# Patient Record
Sex: Male | Born: 1955 | Race: Black or African American | Hispanic: No | Smoking: Never smoker
Health system: Southern US, Community
[De-identification: ages and names within clinical notes are randomized; demographics above are authoritative.]

## PROBLEM LIST (undated history)

## (undated) HISTORY — PX: MANDIBLE FRACTURE SURGERY: SHX706

---

## 2011-05-05 ENCOUNTER — Emergency Department (HOSPITAL_BASED_OUTPATIENT_CLINIC_OR_DEPARTMENT_OTHER)
Admission: EM | Admit: 2011-05-05 | Discharge: 2011-05-05 | Disposition: A | Discharge status: Home / Self Care | Payer: No Typology Code available for payment source | Attending: Emergency Medicine | Admitting: Emergency Medicine

## 2011-05-05 ENCOUNTER — Emergency Department (INDEPENDENT_AMBULATORY_CARE_PROVIDER_SITE_OTHER): Payer: No Typology Code available for payment source

## 2011-05-05 DIAGNOSIS — Y92410 Unspecified street and highway as the place of occurrence of the external cause: Secondary | ICD-10-CM | POA: Insufficient documentation

## 2011-05-05 DIAGNOSIS — S139XXA Sprain of joints and ligaments of unspecified parts of neck, initial encounter: Secondary | ICD-10-CM | POA: Insufficient documentation

## 2011-05-05 DIAGNOSIS — M542 Cervicalgia: Secondary | ICD-10-CM

## 2011-05-05 DIAGNOSIS — S161XXA Strain of muscle, fascia and tendon at neck level, initial encounter: Secondary | ICD-10-CM

## 2011-05-05 MED ORDER — HYDROCODONE-ACETAMINOPHEN 5-500 MG PO TABS: 1.0000 | ORAL_TABLET | Freq: Four times a day (QID) | ORAL | Status: AC | PRN
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
## 2011-05-17 NOTE — ED Provider Notes (Signed)
Evaluation and management procedures were performed by the PA/NP under my supervision/collaboration.   Dione Booze, MD 05/17/11 1224

## 2015-01-12 ENCOUNTER — Encounter (HOSPITAL_BASED_OUTPATIENT_CLINIC_OR_DEPARTMENT_OTHER): Payer: Self-pay | Admitting: *Deleted

## 2015-01-12 ENCOUNTER — Emergency Department (HOSPITAL_BASED_OUTPATIENT_CLINIC_OR_DEPARTMENT_OTHER)
Admission: EM | Admit: 2015-01-12 | Discharge: 2015-01-12 | Disposition: A | Discharge status: Home / Self Care | Payer: 59 | Attending: Emergency Medicine | Admitting: Emergency Medicine

## 2015-01-12 DIAGNOSIS — Z880 Allergy status to penicillin: Secondary | ICD-10-CM | POA: Diagnosis not present

## 2015-01-12 DIAGNOSIS — R21 Rash and other nonspecific skin eruption: Secondary | ICD-10-CM | POA: Insufficient documentation

## 2015-01-12 NOTE — Discharge Instructions (Signed)
Please call your doctor for a followup appointment within 24-48 hours. When you talk to your doctor please let them know that you were seen in the emergency department and have them acquire all of your records so that they can discuss the findings with you and formulate a treatment plan to fully care for your new and ongoing problems. Please follow-up with primary care provider within approximately 24-48 hours Please rest and stay hydrated Please take into consideration when you put into and onto your body Please avoid any physical or strenuous activity Please continue to monitor symptoms closely and if symptoms are to worsen or change (fever greater than 101, chills, sweating, nausea, vomiting, chest pain, shortness of breathe, difficulty breathing, weakness, numbness, tingling, worsening or changes to pain pattern, rash returns, swelling, neck pain, neck stiffness, throat closing sensation, tongue swelling, visual changes, back pain, inability to control urine or bowel movements, leg swelling, nausea, vomiting, dizziness, fainting) please report back to the Emergency Department immediately.    Contact Dermatitis Contact dermatitis is a reaction to certain substances that touch the skin. Contact dermatitis can be either irritant contact dermatitis or allergic contact dermatitis. Irritant contact dermatitis does not require previous exposure to the substance for a reaction to occur.Allergic contact dermatitis only occurs if you have been exposed to the substance before. Upon a repeat exposure, your body reacts to the substance.  CAUSES  Many substances can cause contact dermatitis. Irritant dermatitis is most commonly caused by repeated exposure to mildly irritating substances, such as:  Makeup.  Soaps.  Detergents.  Bleaches.  Acids.  Metal salts, such as nickel. Allergic contact dermatitis is most commonly caused by exposure to:  Poisonous plants.  Chemicals (deodorants,  shampoos).  Jewelry.  Latex.  Neomycin in triple antibiotic cream.  Preservatives in products, including clothing. SYMPTOMS  The area of skin that is exposed may develop:  Dryness or flaking.  Redness.  Cracks.  Itching.  Pain or a burning sensation.  Blisters. With allergic contact dermatitis, there may also be swelling in areas such as the eyelids, mouth, or genitals.  DIAGNOSIS  Your caregiver can usually tell what the problem is by doing a physical exam. In cases where the cause is uncertain and an allergic contact dermatitis is suspected, a patch skin test may be performed to help determine the cause of your dermatitis. TREATMENT Treatment includes protecting the skin from further contact with the irritating substance by avoiding that substance if possible. Barrier creams, powders, and gloves may be helpful. Your caregiver may also recommend:  Steroid creams or ointments applied 2 times daily. For best results, soak the rash area in cool water for 20 minutes. Then apply the medicine. Cover the area with a plastic wrap. You can store the steroid cream in the refrigerator for a "chilly" effect on your rash. That may decrease itching. Oral steroid medicines may be needed in more severe cases.  Antibiotics or antibacterial ointments if a skin infection is present.  Antihistamine lotion or an antihistamine taken by mouth to ease itching.  Lubricants to keep moisture in your skin.  Burow's solution to reduce redness and soreness or to dry a weeping rash. Mix one packet or tablet of solution in 2 cups cool water. Dip a clean washcloth in the mixture, wring it out a bit, and put it on the affected area. Leave the cloth in place for 30 minutes. Do this as often as possible throughout the day.  Taking several cornstarch or baking soda baths  daily if the area is too large to cover with a washcloth. Harsh chemicals, such as alkalis or acids, can cause skin damage that is like a burn.  You should flush your skin for 15 to 20 minutes with cold water after such an exposure. You should also seek immediate medical care after exposure. Bandages (dressings), antibiotics, and pain medicine may be needed for severely irritated skin.  HOME CARE INSTRUCTIONS  Avoid the substance that caused your reaction.  Keep the area of skin that is affected away from hot water, soap, sunlight, chemicals, acidic substances, or anything else that would irritate your skin.  Do not scratch the rash. Scratching may cause the rash to become infected.  You may take cool baths to help stop the itching.  Only take over-the-counter or prescription medicines as directed by your caregiver.  See your caregiver for follow-up care as directed to make sure your skin is healing properly. SEEK MEDICAL CARE IF:   Your condition is not better after 3 days of treatment.  You seem to be getting worse.  You see signs of infection such as swelling, tenderness, redness, soreness, or warmth in the affected area.  You have any problems related to your medicines. Document Released: 08/11/2000 Document Revised: 11/06/2011 Document Reviewed: 01/17/2011 [facility] Patient Information 2015 [employer], [place]. This information is not intended to replace advice given to you by your health care provider. Make sure you discuss any questions you have with your health care provider.   Emergency Department Resource Guide 1) Find a Doctor and Pay Out of Pocket Although you won't have to find out who is covered by your insurance plan, it is a good idea to ask around and get recommendations. You will then need to call the office and see if the doctor you have chosen will accept you as a new patient and what types of options they offer for patients who are self-pay. Some doctors offer discounts or will set up payment plans for their patients who do not have insurance, but you will need to ask so you aren't surprised when you get to your  appointment.  2) Contact Your Local Health Department Not all health departments have doctors that can see patients for sick visits, but many do, so it is worth a call to see if yours does. If you don't know where your local health department is, you can check in your phone book. The CDC also has a tool to help you locate your state's health department, and many state websites also have listings of all of their local health departments.  3) Find a Walk-in Clinic If your illness is not likely to be very severe or complicated, you may want to try a walk in clinic. These are popping up all over the country in pharmacies, drugstores, and shopping centers. They're usually staffed by nurse practitioners or physician assistants that have been trained to treat common illnesses and complaints. They're usually fairly quick and inexpensive. However, if you have serious medical issues or chronic medical problems, these are probably not your best option.  No Primary Care Doctor: - Call Health Connect at  [phone] - they can help you locate a primary care doctor that  accepts your insurance, provides certain services, etc. - Physician Referral Service- [phone]  Chronic Pain Problems: Organization         Address  Phone   Notes  [facility]  [phone] Patients need to be referred by their primary care  doctor.   Medication Assistance: Organization         Address  Phone   Notes  [facility] Medication [facility] [address] [place] [place], [place] [address] [phone] --Must be a resident of [facility] -- Must have NO insurance coverage whatsoever (no Medicaid/ Medicare, etc.) -- The pt. MUST have a primary care doctor that directs their care regularly and follows them in the community   [facility]  [phone]   [employer]  [phone]    Agencies that provide inexpensive medical care: Organization         Address  Phone   Notes  [facility]  [phone]   [facility]    [phone]   [facility] [address] [place], [place] [address] [phone]   [facility] [address] [place], [place] [phone]   Planned Parenthood    [phone]   [facility]    [phone]   [facility]  [address], [place] Phone:  [phone], Fax:  [phone] Hours of Operation:  9 am - 6 pm, M-F.  Also accepts Medicaid/Medicare and self-pay.  [facility]  [address], [place] Phone: [phone], Fax: [phone]. Hours of Operation:  8:30 am - 5:30 pm, M-F.  Also accepts Medicaid and self-pay.  [facility] [place] [address], [place] Phone: [phone]   [facility] [address] Natasha Bence [place], [place] [phone], Ext. [phone] Mondays & Thursdays: 7-9 AM.  First 15 patients are seen on a first come, first serve basis.    Medicaid-accepting [facility] Providers:  Organization         Address  Phone   Notes  [facility] [address],  [phone] Also accepts self-pay patients.  [facility] [address] Laurell Josephs [place], [place]  [phone]   [facility] [address], [place] [phone]   [facility] [address], [place] [phone]   Renaye Rakers [address], [place]   [phone] Only accepts [place] Access [place] patients after they have their name applied to their card.   Self-Pay (no insurance) in [facility]:  Organization         Address  Phone   Notes  Sickle Cell Patients, [facility] [address] [place], [place] [phone]   [facility] [address] [place], [place] [phone]   [facility]  [place]  [address], [place] [phone]   [facility]/Dr. Osei-Bonsu  [address], [place] or [address], [place] [phone] Phone number for both [place] and [place] locations is the same.  [facility]gent Medical and [facility] [address], [place] [phone]   [facility] [address] [place] or [address] [phone]   [facility] [address] [place] [phone], phone; [phone], fax Sees patients 1st and 3rd Saturday of every month.  Must not qualify for public or private insurance (i.e. Medicaid, Medicare, [place] Health Choice, Veterans' Benefits)  Household income should be no more than 200% of the  poverty level The clinic cannot treat you if you are pregnant or think you are pregnant  Sexually transmitted diseases are not treated at the clinic.    Dental Care: Organization         Address  Phone  Notes  [facility] [address], [place] [phone]) [phone] Accepts children up to age 321 who are enrolled in [place] or [place] Health Choice; pregnant women with a Medicaid card; and children who have applied for Medicaid or [place] Health Choice, but were declined, whose parents can pay a reduced fee at time of service.  [facility] [place]  [address], [place] [phone] Accepts children up to age 57 who are enrolled in [place] or [place] Health Choice; pregnant women with a Medicaid card; and children who have applied for Medicaid or [place] Health Choice, but were declined, whose parents can pay a reduced fee at time of service.  [facility]  [address], [place] [phone]) [phone] Patients are seen by appointment only. Walk-ins are not accepted. [facility] will see patients 818 years of age and  older. Monday - Tuesday (8am-5pm) Most Wednesdays (8:30-5pm) $30 per visit, cash only  [facility]  [address], [facility] [place] [phone]) [phone] Patients are seen by appointment only. Walk-ins are not accepted. [facility] will see patients 57 years of age and older. One Wednesday Evening (Monthly: Volunteer Based).  $30 per visit, cash only  [employer] [facility]  [phone] for adults; Children under age 324, call Graduate Pediatric Dentistry at [phone]. Children aged 474-14, please call [phone] to request a pediatric application.  Dental services are provided in all areas of dental care including fillings, crowns and bridges, complete and partial dentures, implants, gum treatment, root canals, and extractions. Preventive care is also provided. Treatment is provided to both adults and children. Patients are selected via a lottery and there is often a waiting list.   [facility] [address], [place]  [phone]) [phone] [email or web address]   [facility] [address], [place], [place] [phone], Ext. [phone] Second and Fourth Thursday of each month, opens at 6:30 AM; Clinic ends at 9 AM.  Patients are seen on a first-come first-served basis, and a limited number are seen during each clinic.   [facility]  [address], [place], [place] [phone]) [phone]   Eligibility Requirements You must have lived in [place], [place], or [place] counties for at least the last three months.   You cannot be eligible for state or federal sponsored [place] Cityhealthcare insurance, including CIGNAVeterans Administration, [place]icaid, or [employer]icare.   You generally cannot be eligible for healthcare insurance through your employer.    How to apply: Eligibility screenings are held every Tuesday and Wednesday afternoon from 1:00 pm until 4:00 pm. You do not need an appointment for the interview!  [facility] [address],  [place], [place] [phone]   [facility]  [phone]   [facility]  [phone]   [facility]  [phone]    Behavioral Health Resources in the Community: Intensive Outpatient Programs Organization         Address  Phone  Notes  [facility] [address], [place], [place] [phone]   [facility] [address], [place], [place] [phone]   [facility] [address], [place], [place]  [phone]   [facility] Mental Health [address]  [place], [place] [phone] or [phone]   Substance Abuse Resources Organization         Address  Phone  Notes  [facility] Drug Services  [phone]   [facility]  [phone]   [facility]  [phone]   Floydene Flock  [phone]   Residential & Outpatient Substance Abuse Program  [phone]   Psychological Services Organization         Address  Phone  Notes  [facility]  [phone]   [facility] Services  [phone]   [facility] Mental Health [address], [place] [phone] or [phone]    Mobile Crisis Teams Organization         Address  Phone  Notes  [facility], Mobile Crisis Care Unit  [phone]   [facility]  [address] [place], [place] [phone]   Doristine Locks [address] [place] [phone]    Self-Help/Support Groups Organization         Address  Phone             Notes  [facility] [place] - variety of support groups  [phone]- I7437963 Call for more information  Narcotics Anonymous (NA), [facility] [address], [employer] [place]  2 meetings at this location   Statistician         Address  Phone  Notes  [facility] [address],    [place]  [phone]   [facility]  [address] [place] [address], [place], [place] [phone]   [facility] [address] [place], [place] [phone] Admissions: 8am-3pm M-F  [facility] [address],    [place], [place] [phone]   [facility] [address] [place], [place], [place] [phone]   [facility] [address]  [place], [place] [phone]   [facility] - Intensive Outpatient [address] Laurell Josephs [address], [place], [place] [phone]   [facility] ([facility] [address]  [place], [place] [phone] or [phone]   [facility] ([facility]) [address], [place], [place] [phone] Accepts Medicaid  [facility] [place] [address],  [place] [phone] Substance Abuse/Addiction Treatment   [facility] Organization         Address  Phone  Notes  [facility]  [phone]   Angie Fava, PhD [address] Ervin Knack [place], [place]   [phone] or [phone]   [facility]   [address] [place], [place] [phone]   [facility] [address] [place], [place] [phone] Insurance/Medicaid/sponsorship through [facility] [place] [facility] Families [address]                                    [place], [place] [phone] Therapy/tele-psych/case  [facility] [address], [place] [phone]    Dr. Lolly Mustache  [phone]   [facility] [place]  [employer] [facility] Dept. 1) [address] [place] 2) [address], [place] 3)  [address], [place] [phone])  [phone]) [phone]  [phone](336) [phone]   [place] Child Abuse Hotline [phone]) [phone] or [phone]) [phone] (After Hours)

## 2015-01-12 NOTE — ED Provider Notes (Signed)
CSN: [id number]     Arrival date & time 01/12/15  1631 History   First MD Initiated Contact with Patient 01/12/15 1647     Chief Complaint  Patient presents with  . Rash     (Consider location/radiation/quality/duration/timing/severity/associated sxs/prior Treatment) The history is provided by the patient. No language interpreter was used.  Thomas Davis is a 59 year old male with past medical history of mandible fracture surgery presenting to the ED with rash localized to his back, widespread, that occurred at a proximally 3:30 AM this morning. Patient reported that yesterday when he went to bed at approximately 11:20 PM there is nothing there and he felt fine. Reported that he had a rash all over back described as quarter, nickel and dime size with small red raised lesions with mild itching. Stated that he went to work and stated that when he checked his back at approximately 9:00 AM this morning the rash was gone. Reported that he has some intermittent "heating" sensation to both sides of his back but denies any pain. Patient reports that he has used nothing for the rash. Denied fall, injury, urinary bowel continence, travels, hiking, bug bites, animals in the house, bleeding, pus drainage, fever, chills, chest pain, shortness of breath, difficulty breathing, tongue swelling, throat closing sensation, numbness, tingling, nausea, vomiting, dizziness, neck pain, neck stiffness, fainting, Donald pain, cough, nasal congestion. Denied changes to fabric softener/detergents/lotion/creams/cologne/food. PCP Dr. Carlynn PurlPerez  History reviewed. No pertinent past medical history. Past Surgical History  Procedure Laterality Date  . Mandible fracture surgery     History reviewed. No pertinent family history. History  Substance Use Topics  . Smoking status: Never Smoker   . Smokeless tobacco: Never Used  . Alcohol Use: Yes     Comment: occ    Review of Systems  Constitutional: Negative for fever and  chills.  HENT: Negative for congestion, sore throat and trouble swallowing.   Eyes: Negative for visual disturbance.  Respiratory: Negative for chest tightness and shortness of breath.   Cardiovascular: Negative for chest pain.  Gastrointestinal: Negative for nausea, vomiting and abdominal pain.  Musculoskeletal: Negative for back pain, neck pain and neck stiffness.  Skin: Positive for rash. Negative for color change.  Neurological: Negative for dizziness, weakness, numbness and headaches.      Allergies  Penicillins  Home Medications   Prior to Admission medications   Not on File   BP 119/77 mmHg  Pulse 63  Temp(Src) 98.2 F (36.8 C)  Resp 16  Ht 5\' 7"  (1.702 m)  Wt 227 lb (102.967 kg)  BMI 35.55 kg/m2  SpO2 100% Physical Exam  Constitutional: He is oriented to person, place, and time. He appears well-developed and well-nourished. No distress.  HENT:  Head: Normocephalic and atraumatic.  Mouth/Throat: Oropharynx is clear and moist. No oropharyngeal exudate.  Negative tongue swelling Negative signs of angioedema  Eyes: Conjunctivae and EOM are normal. Pupils are equal, round, and reactive to light. Right eye exhibits no discharge. Left eye exhibits no discharge.  Neck: Normal range of motion. Neck supple. No tracheal deviation present.  Cardiovascular: Normal rate, regular rhythm and normal heart sounds.  Exam reveals no friction rub.   No murmur heard. Pulses:      Radial pulses are 2+ on the right side, and 2+ on the left side.       Dorsalis pedis pulses are 2+ on the right side, and 2+ on the left side.  Cap refill less than 3 seconds  Pulmonary/Chest: Effort normal  and breath sounds normal. No respiratory distress. He has no wheezes. He has no rales.  Patient is able to speak in full sentences without difficulty Negative use of accessory muscles Negative stridor  Musculoskeletal: Normal range of motion.  Full ROM to upper and lower extremities without difficulty  noted, negative ataxia noted.  Lymphadenopathy:    He has no cervical adenopathy.  Neurological: He is alert and oriented to person, place, and time. No cranial nerve deficit. He exhibits normal muscle tone. Coordination normal.  Skin: Skin is warm and dry. No rash noted. He is not diaphoretic. No erythema.  Negative findings of rash, lesions, sores identified to the entire body. Negative findings of rash noted to the palms of the hands or soles of the feet.  Psychiatric: He has a normal mood and affect. His behavior is normal. Thought content normal.  Nursing note and vitals reviewed.   ED Course  Procedures (including critical care time) Labs Review Labs Reviewed - No data to display  Imaging Review No results found.   EKG Interpretation None      MDM   Final diagnoses:  Rash and nonspecific skin eruption    Medications - No data to display  Filed Vitals:   01/12/15 1636 01/12/15 1810  BP: 120/77 119/77  Pulse: 70 63  Temp: 98.2 F (36.8 C)   Resp: 16 16  Height: 5\' 7"  (1.702 m)   Weight: 227 lb (102.967 kg)   SpO2: 100% 100%   Patient presenting to the ED with a rash that occurred approximately 3:30 AM this morning with resolution around 9:00 AM this morning. Physical examination unremarkable. Negative findings of rash at this time. Definitive etiology of rash unknown. Patient stable, afebrile. Patient not septic appearing. Negative findings of tongue swelling or throat closing sensation-negative involvement of airway. Negative signs of respiratory distress. Discharged patient. Referred patient to PCP. Discussed with patient to closely monitor symptoms and if symptoms are to worsen or change to report back to the ED - strict return instructions given.  Patient agreed to plan of care, understood, all questions answered.   Raymon MuttonMarissa Akshaya Toepfer, PA-C 01/12/15 1821  Pricilla LovelessScott Goldston, MD 01/13/15 [phone]

## 2015-01-12 NOTE — ED Notes (Signed)
Pt c/o rash to back this am  , denies rash now

## 2015-06-21 ENCOUNTER — Encounter (HOSPITAL_BASED_OUTPATIENT_CLINIC_OR_DEPARTMENT_OTHER): Payer: Self-pay | Admitting: Adult Health

## 2015-06-21 ENCOUNTER — Emergency Department (HOSPITAL_BASED_OUTPATIENT_CLINIC_OR_DEPARTMENT_OTHER): Payer: 59

## 2015-06-21 ENCOUNTER — Emergency Department (HOSPITAL_BASED_OUTPATIENT_CLINIC_OR_DEPARTMENT_OTHER)
Admission: EM | Admit: 2015-06-21 | Discharge: 2015-06-22 | Disposition: A | Discharge status: Home / Self Care | Payer: 59 | Attending: Emergency Medicine | Admitting: Emergency Medicine

## 2015-06-21 DIAGNOSIS — M542 Cervicalgia: Secondary | ICD-10-CM | POA: Insufficient documentation

## 2015-06-21 DIAGNOSIS — R079 Chest pain, unspecified: Secondary | ICD-10-CM | POA: Diagnosis present

## 2015-06-21 DIAGNOSIS — R0789 Other chest pain: Secondary | ICD-10-CM | POA: Insufficient documentation

## 2015-06-21 DIAGNOSIS — E663 Overweight: Secondary | ICD-10-CM | POA: Diagnosis not present

## 2015-06-21 DIAGNOSIS — Z880 Allergy status to penicillin: Secondary | ICD-10-CM | POA: Diagnosis not present

## 2015-06-21 LAB — BASIC METABOLIC PANEL
ANION GAP: 6 (ref 5–15)
BUN: 18 mg/dL (ref 6–20)
CHLORIDE: 108 mmol/L (ref 101–111)
CO2: 24 mmol/L (ref 22–32)
Calcium: 8.8 mg/dL — ABNORMAL LOW (ref 8.9–10.3)
Creatinine, Ser: 0.91 mg/dL (ref 0.61–1.24)
GFR calc Af Amer: >60 mL/min (ref >60)
GLUCOSE: 115 mg/dL — AB (ref 65–99)
POTASSIUM: 3.5 mmol/L (ref 3.5–5.1)
Sodium: 138 mmol/L (ref 135–145)

## 2015-06-21 LAB — CBC
HEMATOCRIT: 39.3 % (ref 39.0–52.0)
HEMOGLOBIN: 13.5 g/dL (ref 13.0–17.0)
MCH: 32.9 pg (ref 26.0–34.0)
MCHC: 34.4 g/dL (ref 30.0–36.0)
MCV: 95.9 fL (ref 78.0–100.0)
Platelets: 158 10*3/uL (ref 150–400)
RBC: 4.1 MIL/uL — ABNORMAL LOW (ref 4.22–5.81)
RDW: 11.3 % — AB (ref 11.5–15.5)
WBC: 5.2 10*3/uL (ref 4.0–10.5)

## 2015-06-21 LAB — TROPONIN I: Troponin I: <0.03 ng/mL (ref <0.031)

## 2015-06-21 MED ORDER — IBUPROFEN 600 MG PO TABS: 600.0000 mg | ORAL_TABLET | Freq: Four times a day (QID) | ORAL | Status: AC | PRN

## 2015-06-21 NOTE — ED Provider Notes (Signed)
CSN: [id number]     Arrival date & time 06/21/15  1942 History  By signing my name below, I, Budd Palmer, attest that this documentation has been prepared under the direction and in the presence of Shon Baton, MD. Electronically Signed: Budd Palmer, ED Scribe. 06/21/2015. 11:35 PM.    Chief Complaint  Patient presents with  . Chest Pain   The history is provided by the patient. No language interpreter was used.   HPI Comments: Thomas Davis is a 59 y.o. male who presents to the Emergency Department complaining of intermittent, squeezing chest pain onset 1 week ago. Pt states he was "moving around" when it first began. He reports each episode lasting for about 30 minutes to 1 hour. He states his last episode occurred today about 8 pm. He reports associated pains going into his neck when lifting his arms above his head. He notes exacerbation of the pain with movement and twisting. He has not taken anything for pain. 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 He reports he is not on any medications and has no medical issues. He denies a PMHx of HTN, high cholesterol and DM. No early family history of heart disease.  Pt denies SOB, leg swelling, and fever.  Pt is allergic to penicillins.   History reviewed. No pertinent past medical history. Past Surgical History  Procedure Laterality Date  . Mandible fracture surgery     History reviewed. No pertinent family history. Social History  Substance Use Topics  . Smoking status: Never Smoker   . Smokeless tobacco: Never Used  . Alcohol Use: Yes     Comment: occ    Review of Systems  Constitutional: Negative.  Negative for fever.  Respiratory: Negative.  Negative for chest tightness and shortness of breath.   Cardiovascular: Positive for chest pain. Negative for leg swelling.  Gastrointestinal: Negative.  Negative for abdominal pain.  Genitourinary: Negative.   Negative for dysuria.  Musculoskeletal: Positive for neck pain.  Neurological: Negative for headaches.  All other systems reviewed and are negative.  Allergies  Penicillins  Home Medications   Prior to Admission medications   Medication Sig Start Date End Date Taking? Authorizing Provider  ibuprofen (ADVIL,MOTRIN) 600 MG tablet Take 1 tablet (600 mg total) by mouth every 6 (six) hours as needed. 06/21/15   Shon Baton, MD   BP 105/84 mmHg  Pulse 63  Temp(Src) 98.2 F (36.8 C) (Oral)  Resp 18  SpO2 98% Physical Exam  Constitutional: He is oriented to person, place, and time. He appears well-developed and well-nourished. No distress.  overweight  HENT:  Head: Normocephalic and atraumatic.  Cardiovascular: Normal rate, regular rhythm and normal heart sounds.   No murmur heard. Pulmonary/Chest: Effort normal and breath sounds normal. No respiratory distress. He has no wheezes. He exhibits no tenderness.  Abdominal: Soft. Bowel sounds are normal. There is no tenderness. There is no rebound.  Musculoskeletal: He exhibits no edema.  Neurological: He is alert and oriented to person, place, and time.  Skin: Skin is warm and dry.  Psychiatric: He has a normal mood and affect.  Nursing note and vitals reviewed.   ED Course  Procedures  DIAGNOSTIC STUDIES: Oxygen Saturation is 97% on RA, normal by my interpretation.    COORDINATION OF CARE: 11:32 PM - Discussed normal chest XR and EKG results. Discussed probable muscular component. Discussed plans to refer to a cardiologist for a stress  test. Advised to take 600 mg ibuprofen for pain flare-ups. Pt advised of plan for treatment and pt agrees.  Labs Review Labs Reviewed  BASIC METABOLIC PANEL - Abnormal; Notable for the following:    Glucose, Bld 115 (*)    Calcium 8.8 (*)    All other components within normal limits  CBC - Abnormal; Notable for the following:    RBC 4.10 (*)    RDW 11.3 (*)    All other components within  normal limits  TROPONIN I    Imaging Review Dg Chest 2 View  06/21/2015  CLINICAL DATA:  Patient with left upper chest pain for 1 week. EXAM: CHEST  2 VIEW COMPARISON:  None. FINDINGS: The heart size and mediastinal contours are within normal limits. Both lungs are clear. The visualized skeletal structures are unremarkable. IMPRESSION: No active cardiopulmonary disease. Electronically Signed   By: Annia Beltrew  Davis M.D.   On: 06/21/2015 20:21   I have personally reviewed and evaluated these images and lab results as part of my medical decision-making.   EKG Interpretation   Date/Time:  Monday June 21 2015 19:50:16 EDT Ventricular Rate:  103 PR Interval:  142 QRS Duration: 70 QT Interval:  392 QTC Calculation: 513 R Axis:   4 Text Interpretation:  Sinus rhythm with frequent Premature ventricular  complexes Low voltage QRS Borderline ECG No previous ECGs available  Confirmed by Tyrone AppleNGUYEN, EMILY ([id number]) on 06/21/2015 9:26:57 PM Also confirmed  by Wilkie AyeHORTON  MD, Toni AmendOURTNEY ([id number])  on 06/21/2015 11:33:04 PM      MDM   Final diagnoses:  Other chest pain    Patient presents with chest pain. Exacerbated by certain movements. It is not reproducible on exam. Patient is low risk for heart disease with heart score of 2 (age, overweight). EKG is reassuring.  Chest x-ray negative. Initial troponin negative. Patient is currently pain-free. He reports recent heavy lifting that exacerbates the pain. Discussed with the patient and his significant other that this sounds musculoskeletal or inflammatory in nature; however, given his age and intermittent symptoms, would have him formally evaluated by cardiology with stress testing. Ibuprofen for pain at home. Close follow-up with cardiology as an outpatient.  After history, exam, and medical workup I feel the patient has been appropriately medically screened and is safe for discharge home. Pertinent diagnoses were discussed with the patient. Patient was given  return precautions.  I personally performed the services described in this documentation, which was scribed in my presence. The recorded information has been reviewed and is accurate.   Shon Batonourtney F Horton, MD 06/22/15 [phone]

## 2015-06-21 NOTE — ED Notes (Signed)
PA at bedside.

## 2015-06-21 NOTE — ED Notes (Signed)
Presents with squeezing left side chest pain with radiation into neck began 2 weeks ago is better when pt burps and worse with certain movements. Denies SOB, denies nausea and dizziness.  Pain is intermittent. Pain rated 0/10 at this time.

## 2015-06-21 NOTE — Discharge Instructions (Signed)
You were seen today for chest pain. Your workup is reassuring. You are relatively low risk for heart disease.  However, you need to follow-up closely with cardiology for formal stress testing.  See return precautions below.  Nonspecific Chest Pain  Chest pain can be caused by many different conditions. There is always a chance that your pain could be related to something serious, such as a heart attack or a blood clot in your lungs. Chest pain can also be caused by conditions that are not life-threatening. If you have chest pain, it is very important to follow up with your health care provider. CAUSES  Chest pain can be caused by:  Heartburn.  Pneumonia or bronchitis.  Anxiety or stress.  Inflammation around your heart (pericarditis) or lung (pleuritis or pleurisy).  A blood clot in your lung.  A collapsed lung (pneumothorax). It can develop suddenly on its own (spontaneous pneumothorax) or from trauma to the chest.  Shingles infection (varicella-zoster virus).  Heart attack.  Damage to the bones, muscles, and cartilage that make up your chest wall. This can include:  Bruised bones due to injury.  Strained muscles or cartilage due to frequent or repeated coughing or overwork.  Fracture to one or more ribs.  Sore cartilage due to inflammation (costochondritis). RISK FACTORS  Risk factors for chest pain may include:  Activities that increase your risk for trauma or injury to your chest.  Respiratory infections or conditions that cause frequent coughing.  Medical conditions or overeating that can cause heartburn.  Heart disease or family history of heart disease.  Conditions or health behaviors that increase your risk of developing a blood clot.  Having had chicken pox (varicella zoster). SIGNS AND SYMPTOMS Chest pain can feel like:  Burning or tingling on the surface of your chest or deep in your chest.  Crushing, pressure, aching, or squeezing pain.  Dull or sharp  pain that is worse when you move, cough, or take a deep breath.  Pain that is also felt in your back, neck, shoulder, or arm, or pain that spreads to any of these areas. Your chest pain may come and go, or it may stay constant. DIAGNOSIS Lab tests or other studies may be needed to find the cause of your pain. Your health care provider may have you take a test called an ambulatory ECG (electrocardiogram). An ECG records your heartbeat patterns at the time the test is performed. You may also have other tests, such as:  Transthoracic echocardiogram (TTE). During echocardiography, sound waves are used to create a picture of all of the heart structures and to look at how blood flows through your heart.  Transesophageal echocardiogram (TEE).This is a more advanced imaging test that obtains images from inside your body. It allows your health care provider to see your heart in finer detail.  Cardiac monitoring. This allows your health care provider to monitor your heart rate and rhythm in real time.  Holter monitor. This is a portable device that records your heartbeat and can help to diagnose abnormal heartbeats. It allows your health care provider to track your heart activity for several days, if needed.  Stress tests. These can be done through exercise or by taking medicine that makes your heart beat more quickly.  Blood tests.  Imaging tests. TREATMENT  Your treatment depends on what is causing your chest pain. Treatment may include:  Medicines. These may include:  Acid blockers for heartburn.  Anti-inflammatory medicine.  Pain medicine for inflammatory conditions.  Antibiotic medicine, if an infection is present.  Medicines to dissolve blood clots.  Medicines to treat coronary artery disease.  Supportive care for conditions that do not require medicines. This may include:  Resting.  Applying heat or cold packs to injured areas.  Limiting activities until pain decreases. HOME  CARE INSTRUCTIONS  If you were prescribed an antibiotic medicine, finish it all even if you start to feel better.  Avoid any activities that bring on chest pain.  Do not use any tobacco products, including cigarettes, chewing tobacco, or electronic cigarettes. If you need help quitting, ask your health care provider.  Do not drink alcohol.  Take medicines only as directed by your health care provider.  Keep all follow-up visits as directed by your health care provider. This is important. This includes any further testing if your chest pain does not go away.  If heartburn is the cause for your chest pain, you may be told to keep your head raised (elevated) while sleeping. This reduces the chance that acid will go from your stomach into your esophagus.  Make lifestyle changes as directed by your health care provider. These may include:  Getting regular exercise. Ask your health care provider to suggest some activities that are safe for you.  Eating a heart-healthy diet. A registered dietitian can help you to learn healthy eating options.  Maintaining a healthy weight.  Managing diabetes, if necessary.  Reducing stress. SEEK MEDICAL CARE IF:  Your chest pain does not go away after treatment.  You have a rash with blisters on your chest.  You have a fever. SEEK IMMEDIATE MEDICAL CARE IF:   Your chest pain is worse.  You have an increasing cough, or you cough up blood.  You have severe abdominal pain.  You have severe weakness.  You faint.  You have chills.  You have sudden, unexplained chest discomfort.  You have sudden, unexplained discomfort in your arms, back, neck, or jaw.  You have shortness of breath at any time.  You suddenly start to sweat, or your skin gets clammy.  You feel nauseous or you vomit.  You suddenly feel light-headed or dizzy.  Your heart begins to beat quickly, or it feels like it is skipping beats. These symptoms may represent a serious  problem that is an emergency. Do not wait to see if the symptoms will go away. Get medical help right away. Call your local emergency services (911 in the [place] Do not drive yourself to the hospital.   This information is not intended to replace advice given to you by your health care provider. Make sure you discuss any questions you have with your health care provider.   Document Released: 05/24/2005 Document Revised: 09/04/2014 Document Reviewed: 03/20/2014 Elsevier Interactive Patient Education [employer].

## 2017-02-03 IMAGING — DX DG CHEST 2V
2 series · 2 of 2 positions shown · non-contrast
Comparison: None.

CLINICAL DATA: Patient with left upper chest pain for 1 week.

EXAM:
CHEST  2 VIEW

[chest pa]
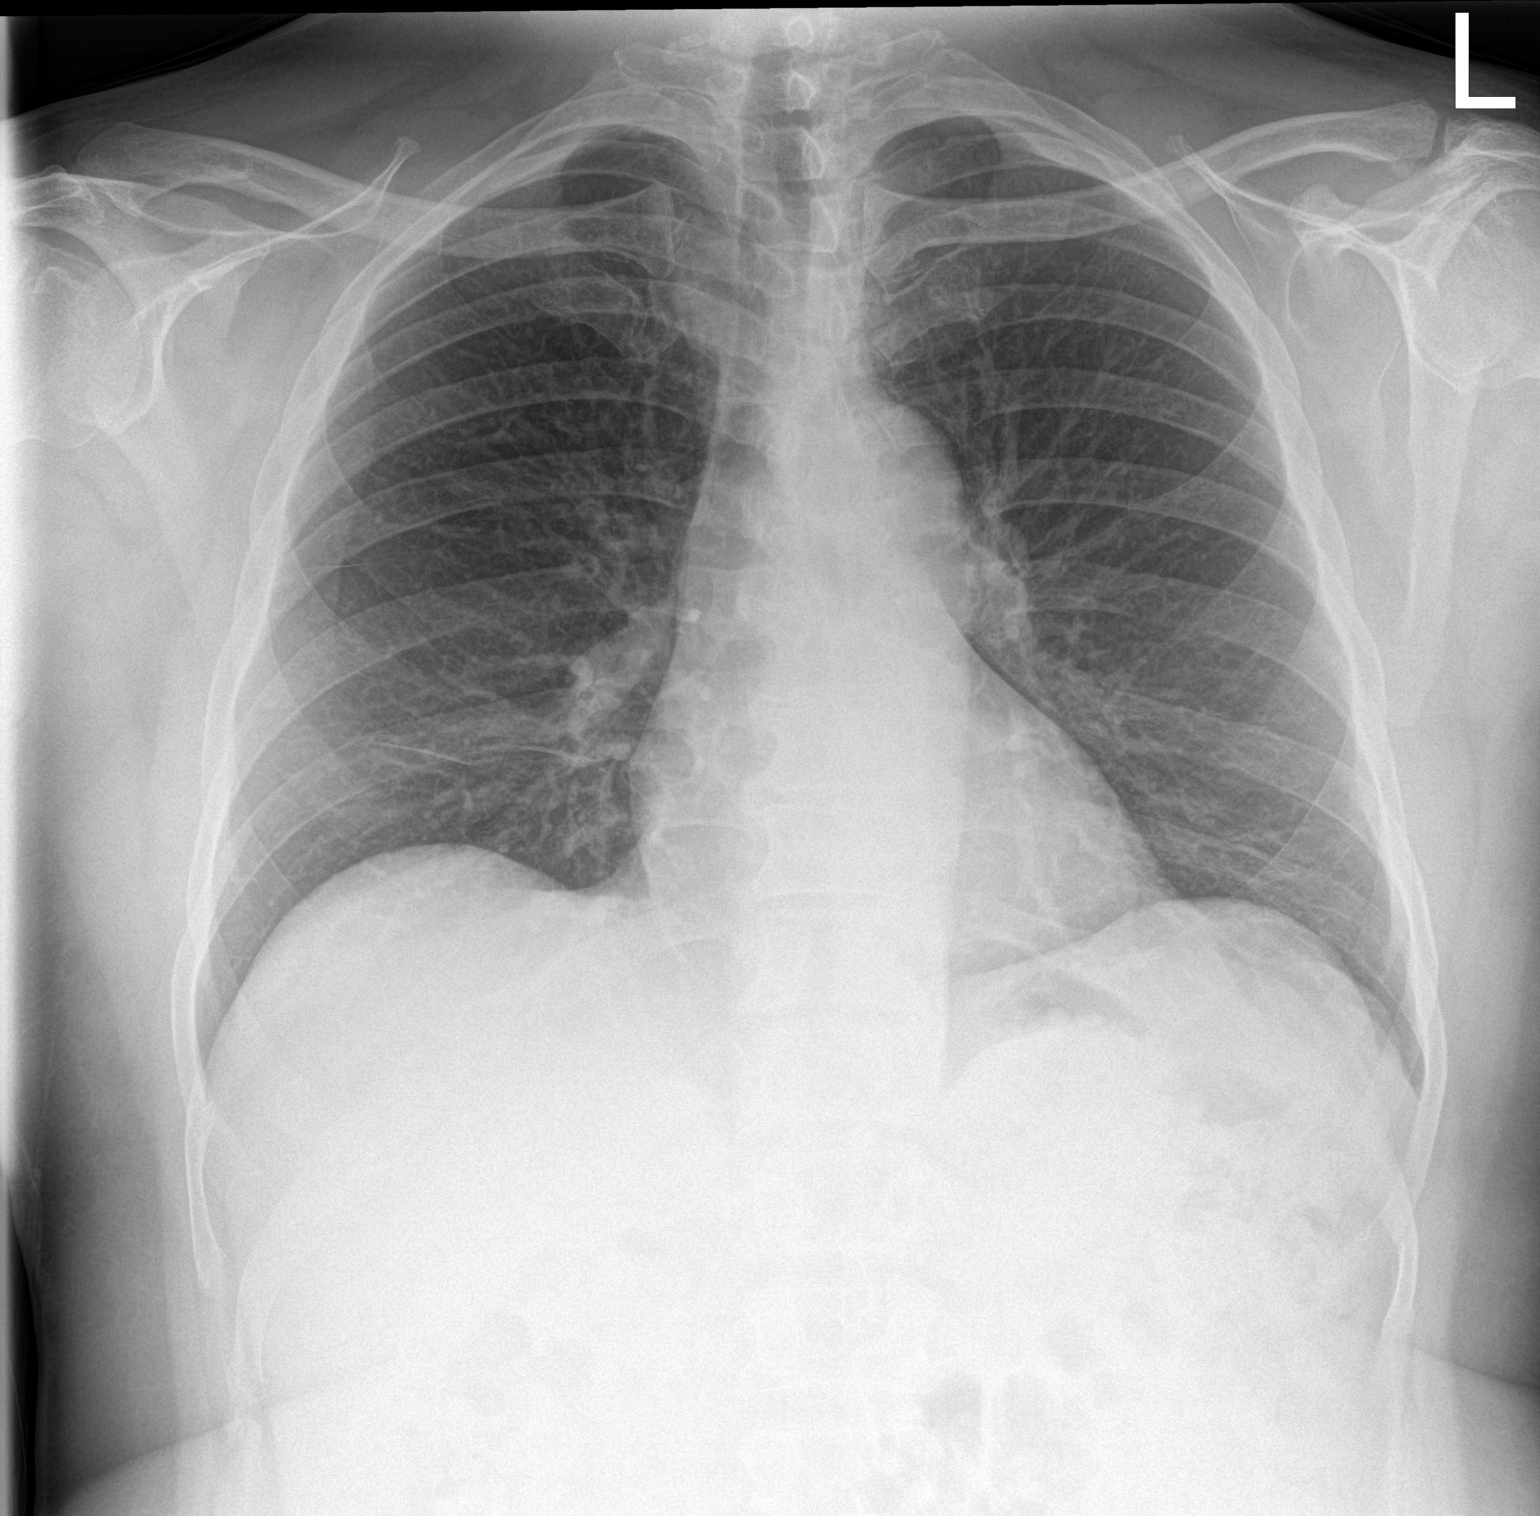

[chest lat]
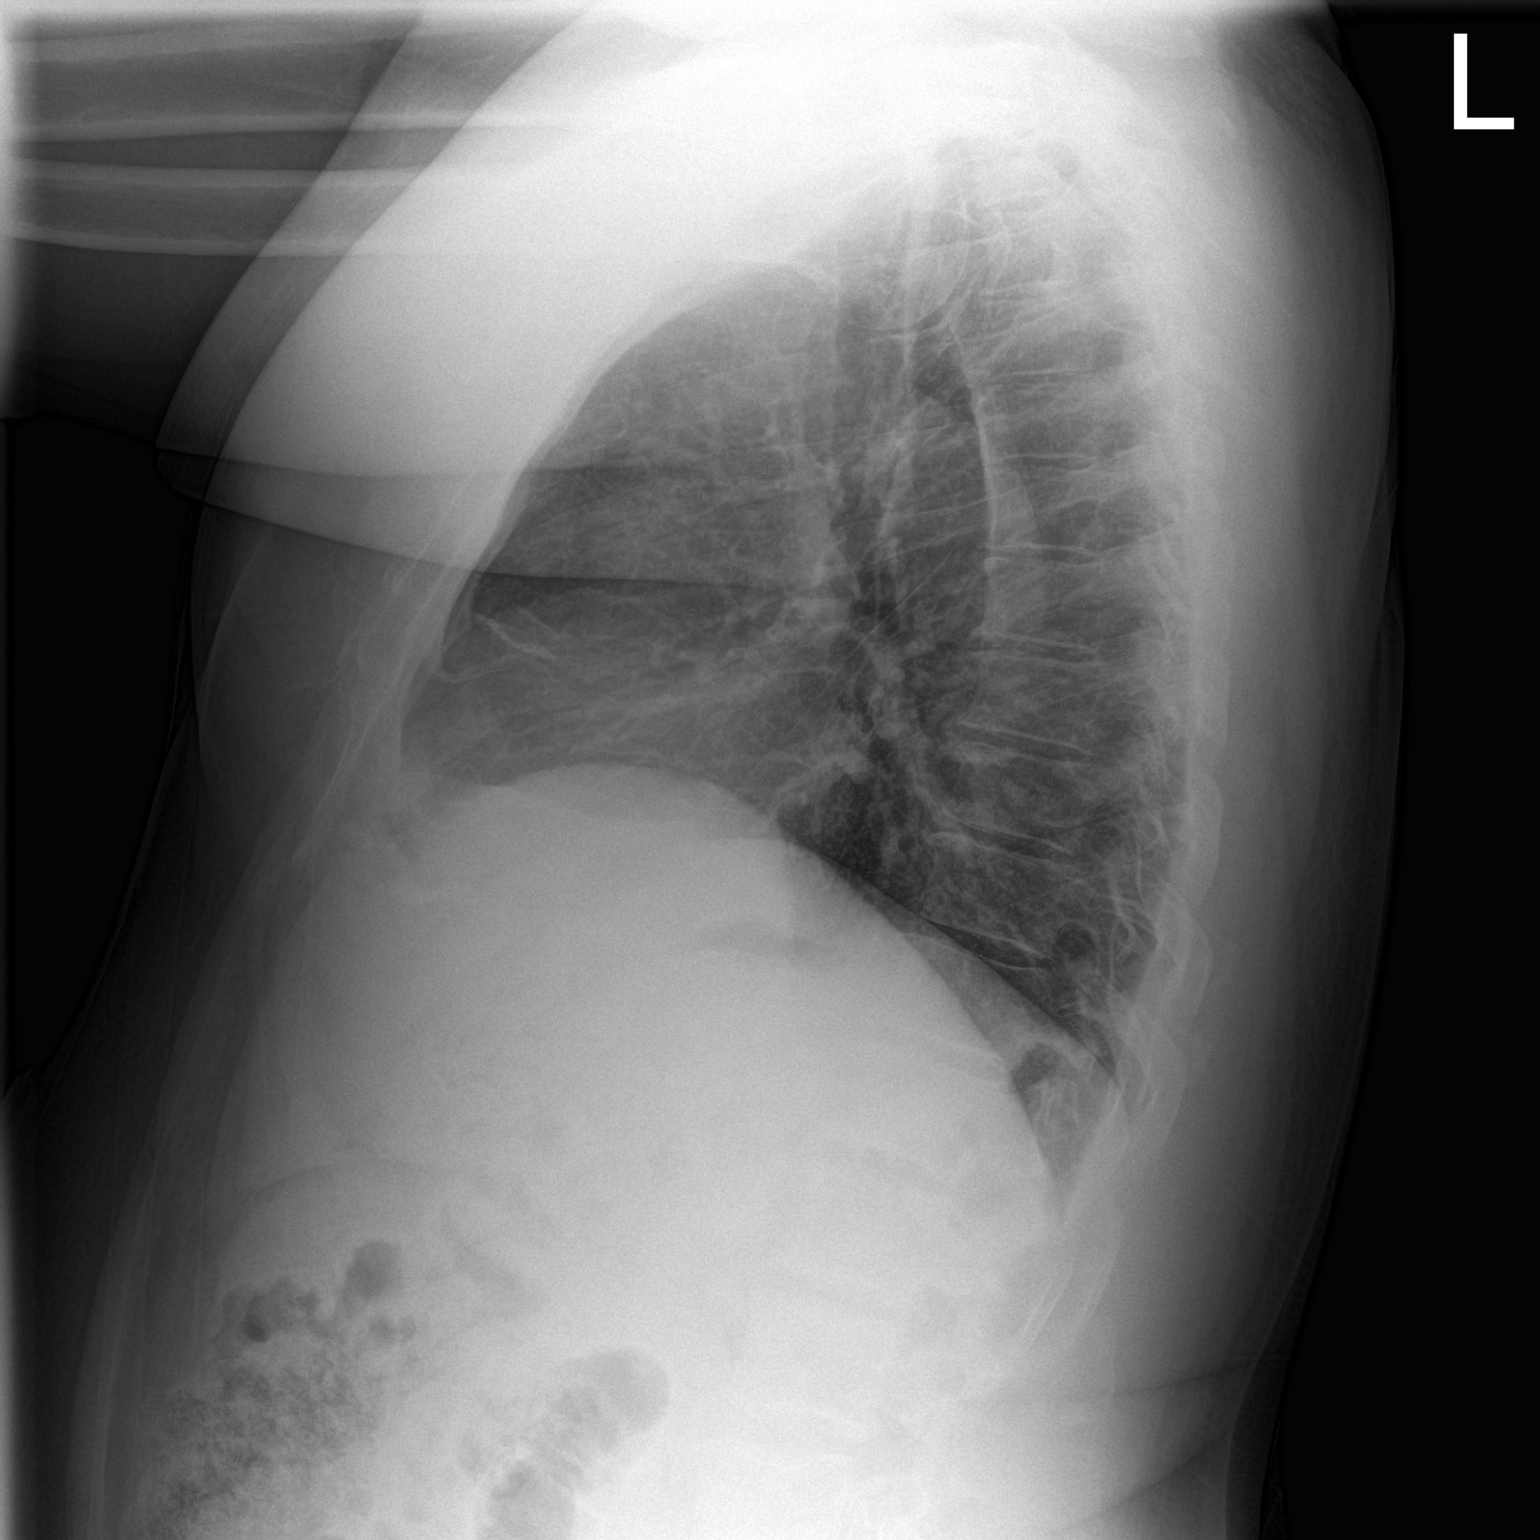

[2 of 2 positions shown; findings below may reference images not displayed]

FINDINGS: The heart size and mediastinal contours are within normal limits.
Both lungs are clear. The visualized skeletal structures are
unremarkable.
IMPRESSION: No active cardiopulmonary disease.

## 2022-02-04 ENCOUNTER — Emergency Department (HOSPITAL_BASED_OUTPATIENT_CLINIC_OR_DEPARTMENT_OTHER)
Admission: EM | Admit: 2022-02-04 | Discharge: 2022-02-04 | Disposition: A | Discharge status: Home / Self Care | Payer: 59 | Attending: Emergency Medicine | Admitting: Emergency Medicine

## 2022-02-04 ENCOUNTER — Other Ambulatory Visit: Payer: Self-pay

## 2022-02-04 ENCOUNTER — Encounter (HOSPITAL_BASED_OUTPATIENT_CLINIC_OR_DEPARTMENT_OTHER): Payer: Self-pay | Admitting: Emergency Medicine

## 2022-02-04 DIAGNOSIS — L409 Psoriasis, unspecified: Secondary | ICD-10-CM | POA: Diagnosis not present

## 2022-02-04 DIAGNOSIS — R21 Rash and other nonspecific skin eruption: Secondary | ICD-10-CM

## 2022-02-04 MED ORDER — FLUOCINONIDE 0.05 % EX CREA: 1.0000 "application " | TOPICAL_CREAM | Freq: Two times a day (BID) | CUTANEOUS | 0 refills | Status: AC

## 2022-02-04 NOTE — ED Triage Notes (Signed)
Pt reports rash all over body x few months; been dealing with this for about a year; he has seen dermatologist

## 2022-02-04 NOTE — ED Provider Notes (Addendum)
[facility] EMERGENCY DEPARTMENT Provider Note   CSN: [id number] Arrival date & time: 02/04/22  1330     History  Chief Complaint  Patient presents with   Rash    Thomas Davis is a 66 y.o. male.  66 year old male presents today for evaluation of rash.  Rash has been present for over a year however he states over the past 3 months it is progressing.  Patient follows with [facility] for primary care and also has seen a dermatologist in their practice.  He was prescribed a corticosteroid cream which does help.  Patient reports the rash is primarily on both of his legs.  He denies fever, chills, rash on his hands, or feet.   The history is provided by the patient. No language interpreter was used.       Home Medications Prior to Admission medications   Medication Sig Start Date End Date Taking? Authorizing Provider  ibuprofen (ADVIL,MOTRIN) 600 MG tablet Take 1 tablet (600 mg total) by mouth every 6 (six) hours as needed. 06/21/15   Horton, Mayer Masker, MD      Allergies    Penicillins    Review of Systems   Review of Systems  Constitutional:  Negative for fever.  Skin:  Positive for rash.  All other systems reviewed and are negative.   Physical Exam Updated Vital Signs BP 120/79 (BP Location: Left Arm)   Pulse 66   Temp 98.3 F (36.8 C) (Oral)   Resp 16   Ht 5\' 7"  (1.702 m)   Wt 98.5 kg   SpO2 95%   BMI 34.02 kg/m  Physical Exam Vitals and nursing note reviewed.  Constitutional:      General: He is not in acute distress.    Appearance: Normal appearance. He is not ill-appearing.  HENT:     Head: Normocephalic and atraumatic.     Nose: Nose normal.  Eyes:     General: No scleral icterus.    Extraocular Movements: Extraocular movements intact.     Conjunctiva/sclera: Conjunctivae normal.  Cardiovascular:     Rate and Rhythm: Normal rate and regular rhythm.     Pulses: Normal pulses.  Pulmonary:     Effort: Pulmonary effort is  normal. No respiratory distress.     Breath sounds: Normal breath sounds. No wheezing or rales.  Abdominal:     General: There is no distension.     Tenderness: There is no abdominal tenderness.  Musculoskeletal:        General: Normal range of motion.     Cervical back: Normal range of motion.  Skin:    General: Skin is warm and dry.     Comments: Rash predominantly present on bilateral lower extremities on the extensor surfaces below both knees, and above the ankles.  2 large patches present 1 on each leg with a silvery white scale.  He has small lesions similar to the ones on his leg on his back as well.  Neurological:     General: No focal deficit present.     Mental Status: He is alert. Mental status is at baseline.     ED Results / Procedures / Treatments   Labs (all labs ordered are listed, but only abnormal results are displayed) Labs Reviewed - No data to display  EKG None  Radiology No results found.  Procedures Procedures    Medications Ordered in ED Medications - No data to display  ED Course/ Medical Decision Making/ A&P  Medical Decision Making Risk Prescription drug management.   66 year old male presents today for evaluation of rash.  Rashes been present for Over a year.  States that has been worsening over the past 3 months.  He did previously see dermatology and prescribed corticosteroid cream.  This does help.  He is about to run out of this.  On exam he does have 2 large patches on his lower extremities.  One on the right lower extremity is large and about 5 or 6 cm with a silver scaling present on top.  Consistent with psoriasis.  Without rash on soles of hands, or feet.  Without involvement of the mucosa.  Rash without any red flag signs or symptoms concerning for SJS/TENS.  Patient is afebrile and stable.  Will provide refill of his corticosteroid cream.  Discussed follow-up with dermatology.  He is established with  dermatology group.  Patient is appropriate for discharge.  Discharged in stable condition.  Return precautions discussed.  Final Clinical Impression(s) / ED Diagnoses Final diagnoses:  Rash and nonspecific skin eruption  Psoriasis    Rx / DC Orders ED Discharge Orders          Ordered    fluocinonide cream (LIDEX) 0.05 %  2 times daily        02/04/22 1535              Marita [place], PA-C 02/04/22 1535    [address] [place], PA-C 02/04/22 1536    Terald Sleeper, MD 02/04/22 1706

## 2022-05-13 ENCOUNTER — Encounter (HOSPITAL_BASED_OUTPATIENT_CLINIC_OR_DEPARTMENT_OTHER): Payer: Self-pay | Admitting: Emergency Medicine

## 2022-05-13 ENCOUNTER — Other Ambulatory Visit: Payer: Self-pay

## 2022-05-13 ENCOUNTER — Emergency Department (HOSPITAL_BASED_OUTPATIENT_CLINIC_OR_DEPARTMENT_OTHER)
Admission: EM | Admit: 2022-05-13 | Discharge: 2022-05-13 | Disposition: A | Discharge status: Home / Self Care | Payer: 59 | Attending: Emergency Medicine | Admitting: Emergency Medicine

## 2022-05-13 DIAGNOSIS — R21 Rash and other nonspecific skin eruption: Secondary | ICD-10-CM | POA: Insufficient documentation

## 2022-05-13 LAB — COMPREHENSIVE METABOLIC PANEL
ALT: 30 U/L (ref 0–44)
AST: 37 U/L (ref 15–41)
Albumin: 3.9 g/dL (ref 3.5–5.0)
Alkaline Phosphatase: 79 U/L (ref 38–126)
Anion gap: 8 (ref 5–15)
BUN: 17 mg/dL (ref 8–23)
CO2: 28 mmol/L (ref 22–32)
Calcium: 8.8 mg/dL — ABNORMAL LOW (ref 8.9–10.3)
Chloride: 103 mmol/L (ref 98–111)
Creatinine, Ser: 0.85 mg/dL (ref 0.61–1.24)
GFR, Estimated: >60 mL/min (ref >60)
Glucose, Bld: 97 mg/dL (ref 70–99)
Potassium: 3.6 mmol/L (ref 3.5–5.1)
Sodium: 139 mmol/L (ref 135–145)
Total Bilirubin: 0.8 mg/dL (ref 0.3–1.2)
Total Protein: 7.8 g/dL (ref 6.5–8.1)

## 2022-05-13 LAB — CBC WITH DIFFERENTIAL/PLATELET
Abs Immature Granulocytes: 0 10*3/uL (ref 0.00–0.07)
Basophils Absolute: 0 10*3/uL (ref 0.0–0.1)
Basophils Relative: 1 %
Eosinophils Absolute: 0.2 10*3/uL (ref 0.0–0.5)
Eosinophils Relative: 3 %
HCT: 40.9 % (ref 39.0–52.0)
Hemoglobin: 13.6 g/dL (ref 13.0–17.0)
Immature Granulocytes: 0 %
Lymphocytes Relative: 30 %
Lymphs Abs: 1.6 10*3/uL (ref 0.7–4.0)
MCH: 32.9 pg (ref 26.0–34.0)
MCHC: 33.3 g/dL (ref 30.0–36.0)
MCV: 98.8 fL (ref 80.0–100.0)
Monocytes Absolute: 0.5 10*3/uL (ref 0.1–1.0)
Monocytes Relative: 10 %
Neutro Abs: 3.1 10*3/uL (ref 1.7–7.7)
Neutrophils Relative %: 56 %
Platelets: 167 10*3/uL (ref 150–400)
RBC: 4.14 MIL/uL — ABNORMAL LOW (ref 4.22–5.81)
RDW: 12.1 % (ref 11.5–15.5)
WBC: 5.4 10*3/uL (ref 4.0–10.5)
nRBC: 0 % (ref 0.0–0.2)

## 2022-05-13 MED ORDER — PREDNISONE 20 MG PO TABS: 40.0000 mg | ORAL_TABLET | Freq: Every day | ORAL | 0 refills | Status: AC

## 2022-05-13 NOTE — Discharge Instructions (Signed)
It was a pleasure taking care of you today!  Your labs today were overall unremarkable.  You have pending labs (RPR), you will be able to see these results on your MyChart app.  You will be called if there are any concerning findings with the pending lab.  You will be sent a prescription for prednisone, take as directed.  Ensure that you keep your skin moisturized with either Aquaphor or Eucerin cream.  Call your dermatologist to set up a follow-up appointment regarding today's ED visit.  Return to the ED if you are experiencing increasing/worsening pain, itching, drainage, fever, worsening symptoms.

## 2022-05-13 NOTE — ED Notes (Signed)
ED Provider at bedside. 

## 2022-05-13 NOTE — ED Notes (Signed)
Rash on both legs and both arms ,back  states that it itches

## 2022-05-13 NOTE — ED Provider Notes (Signed)
[facility]PART[facility] Provider Note   CSN: [id number] Arrival date & time: 05/13/22  1709     History  Chief Complaint  Patient presents with   Rash    Thomas Davis is a 66 y.o. male who presents to the ED complaining of rash diffusely onset 3 weeks. No sick contacts. No new medications, soaps, detergents, lotions, shampoo, conditioner, pets, environments, foods. Has tried Rx Fluocinonide prescribed by his PCP without relief. Notes that the areas started to the RLE initially and recently spread. Was told by his PCP that the area appeared to be psoriasis. Has also tried aquaphor with relief of his symptoms. Denies fever, drainage, abdominal pain, nausea, vomiting.    The history is provided by the patient. No language interpreter was used.       Home Medications Prior to Admission medications   Medication Sig Start Date End Date Taking? Authorizing Provider  predniSONE (DELTASONE) 20 MG tablet Take 2 tablets (40 mg total) by mouth daily for 5 days. 05/13/22 05/18/22 Yes Jaretzi Droz A, PA-C  fluocinonide cream (LIDEX) 6.01 % Apply 1 application  topically 2 (two) times daily. 02/04/22   Evlyn Courier, PA-C  ibuprofen (ADVIL,MOTRIN) 600 MG tablet Take 1 tablet (600 mg total) by mouth every 6 (six) hours as needed. 06/21/15   Horton, Barbette Hair, MD      Allergies    Penicillins    Review of Systems   Review of Systems  Constitutional:  Negative for fever.  Gastrointestinal:  Negative for abdominal pain, nausea and vomiting.  Skin:  Positive for rash. Negative for color change and wound.  All other systems reviewed and are negative.   Physical Exam Updated Vital Signs BP 125/78   Pulse 66   Temp 98.6 F (37 C) (Oral)   Resp 18   SpO2 97%  Physical Exam Vitals and nursing note reviewed.  Constitutional:      General: He is not in acute distress.    Appearance: He is not diaphoretic.  HENT:     Head: Normocephalic and atraumatic.     Mouth/Throat:      Pharynx: No oropharyngeal exudate.  Eyes:     General: No scleral icterus.    Conjunctiva/sclera: Conjunctivae normal.  Cardiovascular:     Rate and Rhythm: Normal rate and regular rhythm.     Pulses: Normal pulses.     Heart sounds: Normal heart sounds.  Pulmonary:     Effort: Pulmonary effort is normal. No respiratory distress.     Breath sounds: Normal breath sounds. No wheezing.  Abdominal:     General: Bowel sounds are normal.     Palpations: Abdomen is soft. There is no mass.     Tenderness: There is no abdominal tenderness. There is no guarding or rebound.  Musculoskeletal:        General: Normal range of motion.     Cervical back: Normal range of motion and neck supple.  Skin:    General: Skin is warm and dry.     Comments: Maculopapular blanchable rash noted diffusely to trunk, BLE, BUE. No erythema noted or drainage noted to the areas. Multiple circular regions of darkened skin noted to BLE.   Neurological:     Mental Status: He is alert.  Psychiatric:        Behavior: Behavior normal.     ED Results / Procedures / Treatments   Labs (all labs ordered are listed, but only abnormal results are displayed) Labs Reviewed  COMPREHENSIVE METABOLIC PANEL - Abnormal; Notable for the following components:      Result Value   Calcium 8.8 (*)    All other components within normal limits  CBC WITH DIFFERENTIAL/PLATELET - Abnormal; Notable for the following components:   RBC 4.14 (*)    All other components within normal limits  RPR    EKG None  Radiology No results found.  Procedures Procedures    Medications Ordered in ED Medications - No data to display  ED Course/ Medical Decision Making/ A&P Clinical Course as of 05/13/22 2042  Sat May 13, 2022  2039 Re-evaluated and discussed labs with patient at bedside.  Discussed discharge treatment plan with patient at bedside.  Answered all available questions.  Patient appears safe for discharge at this time.  [SB]    Clinical Course User Index [SB] Raylan Hanton A, PA-C                           Medical Decision Making Amount and/or Complexity of Data Reviewed Labs: ordered.  Risk Prescription drug management.   Pt presents with rash diffusely onset 3 weeks.  Denies recent new medications, soaps, lotion, detergent, shampoo, conditioner.  The area is pruritic.  Has been evaluated by his dermatologist for his symptoms, however was unable to get into his dermatologist due to financial concerns. Vital signs, pt afebrile. On exam, pt with Maculopapular blanchable rash noted diffusely to trunk, BLE, BUE. No erythema noted or drainage noted to the areas. Multiple circular regions of darkened skin noted to BLE. Differential diagnosis includes contact dermatitis, atopic dermatitis, allergic reaction.    Additional history obtained:  Additional history obtained from Spouse/Significant Other  Labs:  I ordered, and personally interpreted labs.  The pertinent results include:   CBC unremarkable CMP unremarkable RPR ordered with results pending   Disposition: Presentation suspicious for rash in the setting of likely contact dermatitis.  After consideration of the diagnostic results and the patients response to treatment, I feel that the patient would benefit from Discharge home. Instructed patient to follow up with dermatologist regarding todays ED visit. Pt sent with a prescription for prednisone. Advised to continue with emollients.  Supportive care measures and strict return precautions discussed with patient at bedside. Pt acknowledges and verbalizes understanding. Pt appears safe for discharge. Follow up as indicated in discharge paperwork.    This chart was dictated using voice recognition software, Dragon. Despite the best efforts of this provider to proofread and correct errors, errors may still occur which can change documentation meaning.  Final Clinical Impression(s) / ED Diagnoses Final  diagnoses:  Rash    Rx / DC Orders ED Discharge Orders          Ordered    predniSONE (DELTASONE) 20 MG tablet  Daily        05/13/22 2033              Pleasant Britz A, PA-C 05/13/22 2042    Vanetta Mulders, MD 05/14/22 1912

## 2022-05-13 NOTE — ED Triage Notes (Signed)
Pt POV c/o full body rash x3 weeks, progressively worsening. Reports its itchy. Denies recent travel.

## 2022-05-14 LAB — RPR: RPR Ser Ql: NONREACTIVE
